# Patient Record
Sex: Male | Born: 1995 | Race: White | Hispanic: No | Marital: Single | Smoking: Never smoker
Health system: Southern US, Community
[De-identification: ages and names within clinical notes are randomized; demographics above are authoritative.]

---
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
## 2018-06-07 ENCOUNTER — Emergency Department (HOSPITAL_COMMUNITY)
Admission: EM | Admit: 2018-06-07 | Discharge: 2018-06-07 | Disposition: A | Discharge status: Home / Self Care | Payer: 59 | Attending: Emergency Medicine | Admitting: Emergency Medicine

## 2018-06-07 ENCOUNTER — Encounter (HOSPITAL_COMMUNITY): Payer: Self-pay | Admitting: Emergency Medicine

## 2018-06-07 ENCOUNTER — Emergency Department (HOSPITAL_COMMUNITY): Payer: 59

## 2018-06-07 ENCOUNTER — Other Ambulatory Visit: Payer: Self-pay

## 2018-06-07 DIAGNOSIS — R109 Unspecified abdominal pain: Secondary | ICD-10-CM | POA: Diagnosis present

## 2018-06-07 DIAGNOSIS — N201 Calculus of ureter: Secondary | ICD-10-CM

## 2018-06-07 LAB — CBC WITH DIFFERENTIAL/PLATELET
Abs Immature Granulocytes: 0.03 10*3/uL (ref 0.00–0.07)
Basophils Absolute: 0 10*3/uL (ref 0.0–0.1)
Basophils Relative: 0 %
Eosinophils Absolute: 0 10*3/uL (ref 0.0–0.5)
Eosinophils Relative: 0 %
HCT: 45.5 % (ref 39.0–52.0)
Hemoglobin: 16 g/dL (ref 13.0–17.0)
Immature Granulocytes: 0 %
Lymphocytes Relative: 11 %
Lymphs Abs: 1.4 10*3/uL (ref 0.7–4.0)
MCH: 31.6 pg (ref 26.0–34.0)
MCHC: 35.2 g/dL (ref 30.0–36.0)
MCV: 89.9 fL (ref 80.0–100.0)
Monocytes Absolute: 1 10*3/uL (ref 0.1–1.0)
Monocytes Relative: 8 %
Neutro Abs: 10.1 10*3/uL — ABNORMAL HIGH (ref 1.7–7.7)
Neutrophils Relative %: 81 %
Platelets: 310 10*3/uL (ref 150–400)
RBC: 5.06 MIL/uL (ref 4.22–5.81)
RDW: 12 % (ref 11.5–15.5)
WBC: 12.6 10*3/uL — ABNORMAL HIGH (ref 4.0–10.5)
nRBC: 0 % (ref 0.0–0.2)

## 2018-06-07 LAB — BASIC METABOLIC PANEL
Anion gap: 10 (ref 5–15)
BUN: 11 mg/dL (ref 6–20)
CO2: 23 mmol/L (ref 22–32)
Calcium: 9.6 mg/dL (ref 8.9–10.3)
Chloride: 106 mmol/L (ref 98–111)
Creatinine, Ser: 1.68 mg/dL — ABNORMAL HIGH (ref 0.61–1.24)
GFR calc Af Amer: >60 mL/min (ref >60)
GFR calc non Af Amer: 56 mL/min — ABNORMAL LOW (ref >60)
Glucose, Bld: 120 mg/dL — ABNORMAL HIGH (ref 70–99)
Potassium: 4.5 mmol/L (ref 3.5–5.1)
Sodium: 139 mmol/L (ref 135–145)

## 2018-06-07 LAB — LIPASE, BLOOD: Lipase: 38 U/L (ref 11–51)

## 2018-06-07 MED ORDER — MORPHINE SULFATE (PF) 4 MG/ML IV SOLN
4.0000 mg | Freq: Once | INTRAVENOUS | Status: AC
  Administered 2018-06-07: 4 mg via INTRAVENOUS
  Filled 2018-06-07: qty 1

## 2018-06-07 MED ORDER — PERCOCET 5-325 MG PO TABS: 1.0000 | ORAL_TABLET | Freq: Four times a day (QID) | ORAL | 0 refills | Status: AC | PRN

## 2018-06-07 MED ORDER — PROMETHAZINE HCL 25 MG PO TABS: 25.0000 mg | ORAL_TABLET | Freq: Four times a day (QID) | ORAL | 0 refills | Status: AC | PRN

## 2018-06-07 MED ORDER — KETOROLAC TROMETHAMINE 30 MG/ML IJ SOLN
30.0000 mg | Freq: Once | INTRAMUSCULAR | Status: AC
  Administered 2018-06-07: 30 mg via INTRAVENOUS
  Filled 2018-06-07: qty 1

## 2018-06-07 MED ORDER — TAMSULOSIN HCL 0.4 MG PO CAPS: 0.4000 mg | ORAL_CAPSULE | Freq: Every day | ORAL | 0 refills | Status: AC

## 2018-06-07 MED ORDER — ONDANSETRON HCL 4 MG/2ML IJ SOLN
4.0000 mg | Freq: Once | INTRAMUSCULAR | Status: AC
  Administered 2018-06-07: 4 mg via INTRAVENOUS
  Filled 2018-06-07: qty 2

## 2018-06-07 MED ORDER — IBUPROFEN 800 MG PO TABS: 800.0000 mg | ORAL_TABLET | Freq: Three times a day (TID) | ORAL | 0 refills | Status: AC | PRN

## 2018-06-07 MED ORDER — SODIUM CHLORIDE 0.9 % IV BOLUS
1000.0000 mL | Freq: Once | INTRAVENOUS | Status: AC
  Administered 2018-06-07: 1000 mL via INTRAVENOUS

## 2018-06-07 NOTE — ED Notes (Signed)
Pt states he is able to call and update his family- declined RN calling his family at this time.

## 2018-06-07 NOTE — Discharge Instructions (Addendum)
Follow-up with the urologist provided.  Return here as needed.  Increase your fluid intake.

## 2018-06-07 NOTE — ED Triage Notes (Signed)
Pt in with sharp RLQ and R flank pain, gradual onset since last night. Emesis x 3, RLQ tender and intermittent.

## 2018-06-07 NOTE — ED Notes (Signed)
Patient Alert and oriented to baseline. Stable and ambulatory to baseline. Patient verbalized understanding of the discharge instructions.  Patient belongings were taken by the patient.   

## 2018-06-07 NOTE — ED Triage Notes (Signed)
Pt states he started having right sided abd pain-right  flank/back pain last evening. Pt states he has had nausea and vomiting. Denies urinary symptoms.

## 2018-06-07 NOTE — ED Notes (Signed)
Pt in CT.

## 2018-06-07 NOTE — ED Provider Notes (Signed)
[name] EMERGENCY DEPARTMENT Provider Note   CSN: [id number] Arrival date & time: 06/07/18  0857    History   Chief Complaint Chief Complaint  Patient presents with  . Flank Pain  . Abdominal Pain    HPI Benjamin Benjamin is a 23 y.o. male.     HPI Patient presents to the emergency department with right-sided flank pain that started last night.  Patient states the pain got worse throughout the night.  He states that he started having vomiting and nausea.  He states that he did not take any medications prior to arrival for his symptoms.  Patient states he has no surgical history.  The patient denies chest pain, shortness of breath, headache,blurred vision, neck pain, fever, cough, weakness, numbness, dizziness, anorexia, edema, rash, back pain, dysuria, hematemesis, bloody stool, near syncope, or syncope. History reviewed. No pertinent past medical history.  There are no active problems to display for this patient.   History reviewed. No pertinent surgical history.      Home Medications    Prior to Admission medications   Not on File    Family History No family history on file.  Social History Social History   Tobacco Use  . Smoking status: Never Smoker  . Smokeless tobacco: Never Used  Substance Use Topics  . Alcohol use: Never    Frequency: Never  . Drug use: Never     Allergies   Patient has no allergy information on record.   Review of Systems Review of Systems All other systems negative except as documented in the HPI. All pertinent positives and negatives as reviewed in the HPI.  Physical Exam Updated Vital Signs BP 129/84   Pulse 80   Temp 98.7 F (37.1 C) (Oral)   Resp 15   Ht 6' (1.829 m)   Wt 90.7 kg   SpO2 98%   BMI 27.12 kg/m   Physical Exam Vitals signs and nursing note reviewed.  Constitutional:      General: He is not in acute distress.    Appearance: He is well-developed.  HENT:     Head: Normocephalic  and atraumatic.  Eyes:     Pupils: Pupils are equal, round, and reactive to light.  Neck:     Musculoskeletal: Normal range of motion and neck supple.  Cardiovascular:     Rate and Rhythm: Normal rate and regular rhythm.     Heart sounds: Normal heart sounds. No murmur. No friction rub. No gallop.   Pulmonary:     Effort: Pulmonary effort is normal. No respiratory distress.     Breath sounds: Normal breath sounds. No wheezing.  Abdominal:     General: Bowel sounds are normal. There is no distension.     Palpations: Abdomen is soft.     Tenderness: There is no abdominal tenderness.  Skin:    General: Skin is warm and dry.     Capillary Refill: Capillary refill takes less than 2 seconds.     Findings: No erythema or rash.  Neurological:     Mental Status: He is alert and oriented to person, place, and time.     Motor: No abnormal muscle tone.     Coordination: Coordination normal.  Psychiatric:        Behavior: Behavior normal.      ED Treatments / Results  Labs (all labs ordered are listed, but only abnormal results are displayed) Labs Reviewed  BASIC METABOLIC PANEL - Abnormal; Notable for the following  components:      Result Value   Glucose, Bld 120 (*)    Creatinine, Ser 1.68 (*)    GFR calc non Af Amer 56 (*)    All other components within normal limits  CBC WITH DIFFERENTIAL/PLATELET - Abnormal; Notable for the following components:   WBC 12.6 (*)    Neutro Abs 10.1 (*)    All other components within normal limits  LIPASE, BLOOD  URINALYSIS, ROUTINE W REFLEX MICROSCOPIC    EKG None  Radiology Ct Renal Stone Study  Result Date: 06/07/2018 CLINICAL DATA:  Right flank pain EXAM: CT ABDOMEN AND PELVIS WITHOUT CONTRAST TECHNIQUE: Multidetector CT imaging of the abdomen and pelvis was performed following the standard protocol without oral or IV contrast. COMPARISON:  None. FINDINGS: Lower chest: There is slight posterior left base atelectasis. There is no lung base  edema or consolidation. Hepatobiliary: No focal liver lesions are apparent on this noncontrast enhanced study. Gallbladder wall is not appreciably thickened. There is no biliary duct dilatation. Pancreas: No pancreatic mass or inflammatory focus. Spleen: No splenic lesions are evident. Adrenals/Urinary Tract: Adrenals bilaterally appear normal. The right kidney is mildly edematous. There is no evident renal mass on either side. There is moderate hydronephrosis on the right. There is no appreciable hydronephrosis on the left. There is a 2 mm calculus in the lower pole left kidney. There is no intrarenal calculus on the right. There is a 2 mm calculus in the proximal right ureter at the L3 level. No other ureteral calculi are evident on either side. Urinary bladder is midline with wall thickness within normal limits. Stomach/Bowel: There is no appreciable bowel wall or mesenteric thickening. There is no evident bowel obstruction. Terminal ileum appears normal. There is no free air or portal venous air. Vascular/Lymphatic: There is no abdominal aortic aneurysm. No vascular lesions are demonstrable on this study. There is no adenopathy in the abdomen or pelvis. Reproductive: Prostate and seminal vesicles appear normal in size and contour. There is no appreciable pelvic mass. Other: The appendix appears normal. There is no evident abscess or ascites in the abdomen or pelvis. Musculoskeletal: There are no blastic or lytic bone lesions. There is no intramuscular or abdominal wall lesion. IMPRESSION: 1. 2 mm calculus at the level of L3 on the right causing moderate hydronephrosis on the right. 2.  Nonobstructing 2 mm calculus lower pole left kidney. 3. No evident bowel obstruction. No abscess in the abdomen or pelvis. Appendix appears normal. Electronically Signed   By: Bretta Bang III M.D.   On: 06/07/2018 10:49    Procedures Procedures (including critical care time)  Medications Ordered in ED Medications   morphine 4 MG/ML injection 4 mg (4 mg Intravenous Given 06/07/18 0931)  ondansetron (ZOFRAN) injection 4 mg (4 mg Intravenous Given 06/07/18 0931)  sodium chloride 0.9 % bolus 1,000 mL (1,000 mLs Intravenous New Bag/Given 06/07/18 0931)  ketorolac (TORADOL) 30 MG/ML injection 30 mg (30 mg Intravenous Given 06/07/18 1213)     Initial Impression / Assessment and Plan / ED Course  I have reviewed the triage vital signs and the nursing notes.  Pertinent labs & imaging results that were available during my care of the patient were reviewed by me and considered in my medical decision making (see chart for details).        Patient has dramatically improved following pain medications.  The patient will be discharged home with medications and given referral to urology.  The patient is advised of  the CT scan findings along with laboratory results.  Patient was given IV hydration due to his creatinine.  Patient is advised to increase his fluid intake.  Final Clinical Impressions(s) / ED Diagnoses   Final diagnoses:  None    ED Discharge Orders    None       Charlestine NightLawyer, Brenda Cowher, PA-C 06/07/18 1231    Benjiman CorePickering, Nathan, MD 06/07/18 1530

## 2020-08-07 IMAGING — CT CT RENAL STONE PROTOCOL
2 of 4 series · 16 of 46 positions shown, 18 images · non-contrast
Comparison: None.

CLINICAL DATA: Right flank pain

EXAM:
CT ABDOMEN AND PELVIS WITHOUT CONTRAST
TECHNIQUE: Multidetector CT imaging of the abdomen and pelvis was performed
following the standard protocol without oral or IV contrast.

[Series 3: renal stone 5.0 · axial · 0.83mm/px · z∈[-820,-325]mm · 13 of 109 slices shown, 15 images]
[im 5/109  soft-tissue]
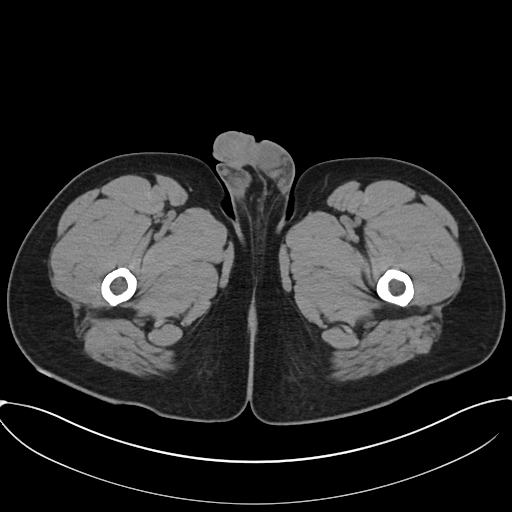
[im 5/109  bone]
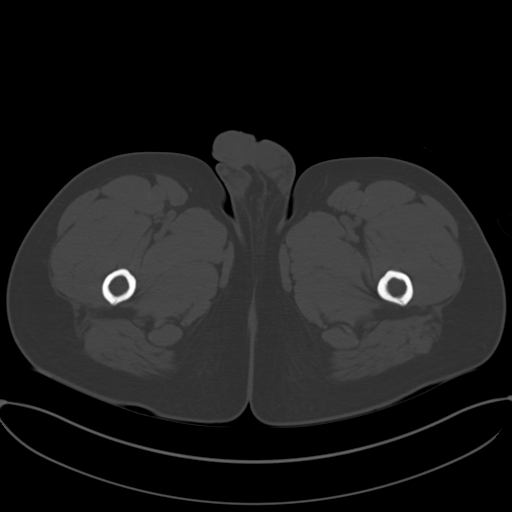
[im 13/109  soft-tissue]
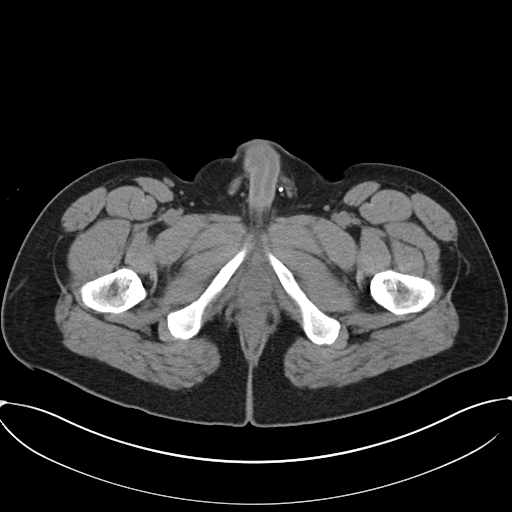
[im 22/109  soft-tissue]
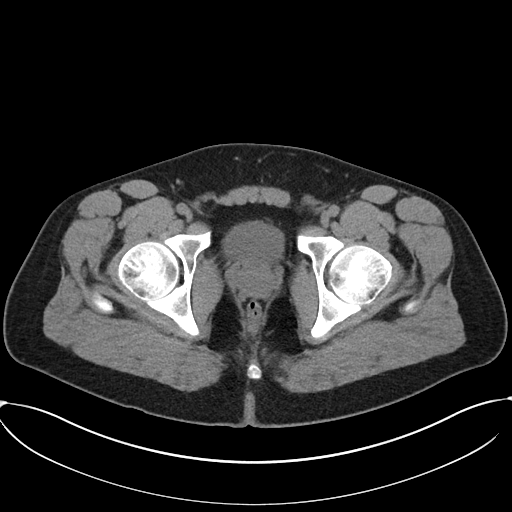
[im 31/109  soft-tissue]
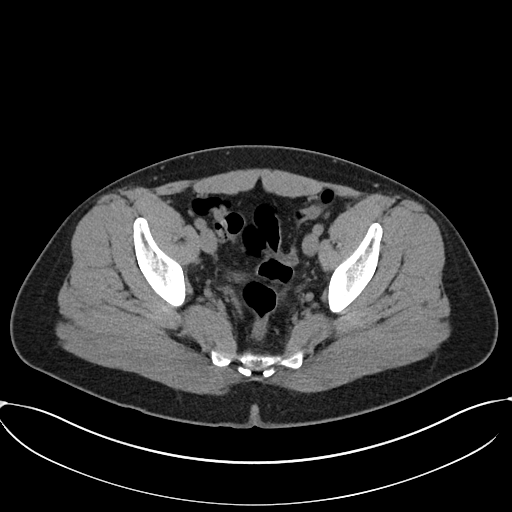
[im 39/109  soft-tissue]
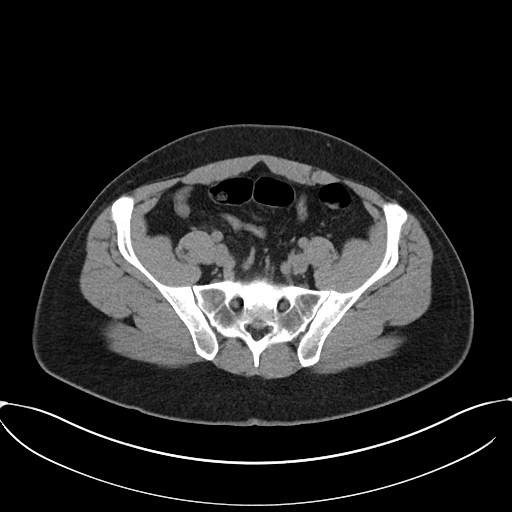
[im 48/109  soft-tissue]
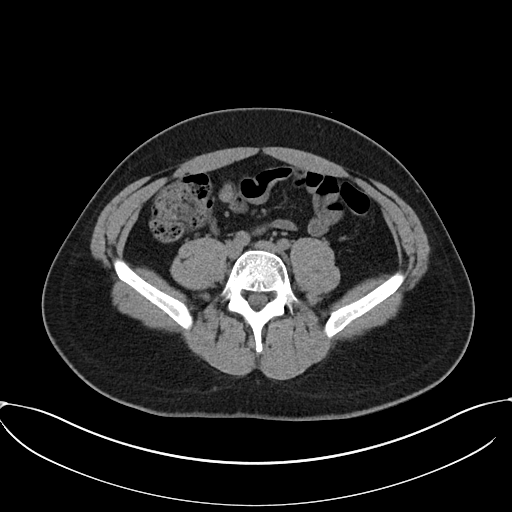
[im 57/109  soft-tissue]
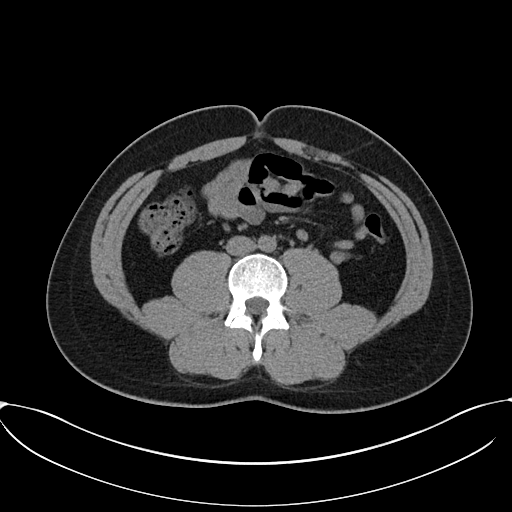
[im 61/109  soft-tissue]
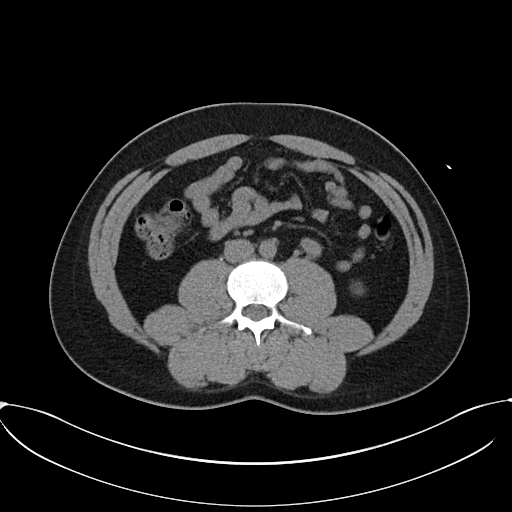
[im 70/109  soft-tissue]
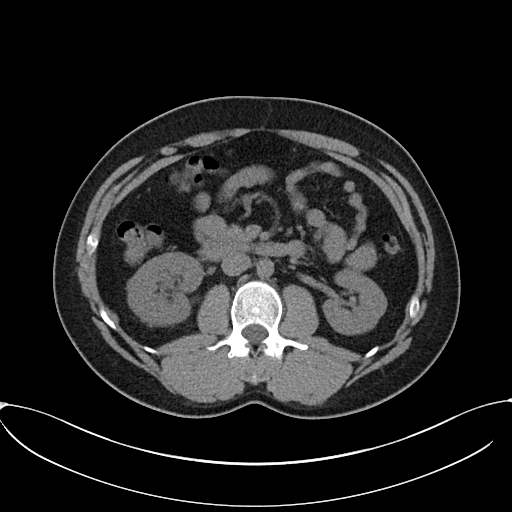
[im 70/109  bone]
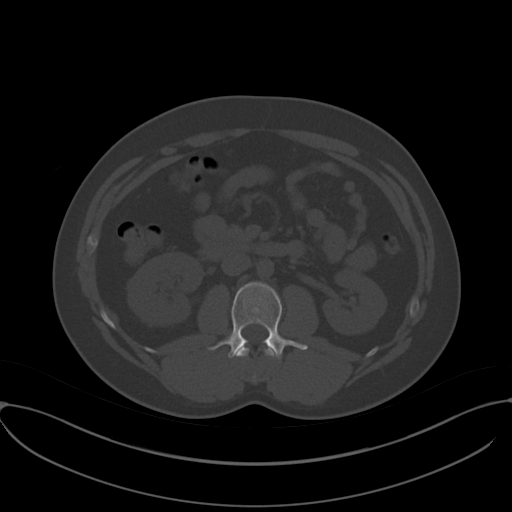
[im 78/109  soft-tissue]
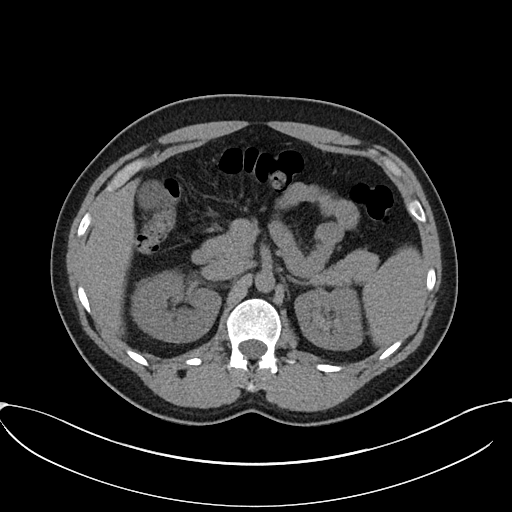
[im 87/109  soft-tissue]
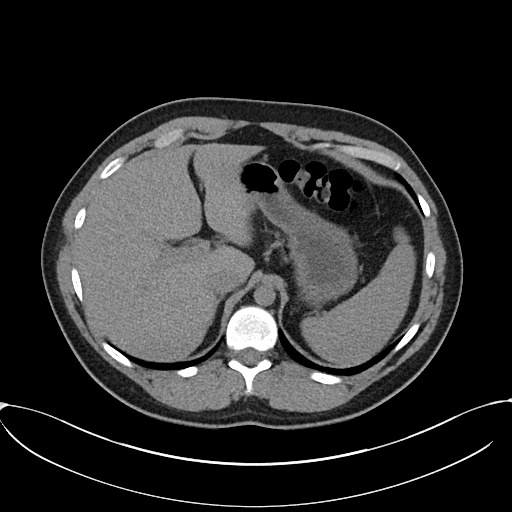
[im 96/109  soft-tissue]
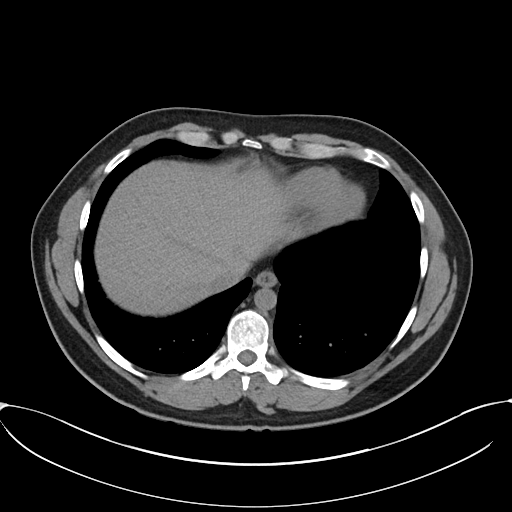
[im 104/109  soft-tissue]
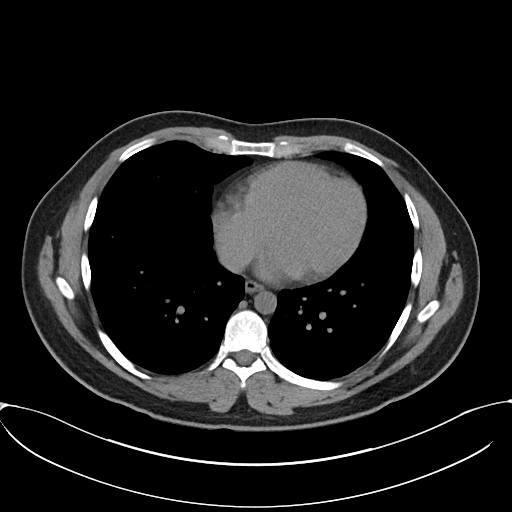

[Series 5: renal stone 3.0 cor · coronal · 1.26mm/px · 3 of 101 slices shown]
[im 34/101  soft-tissue]
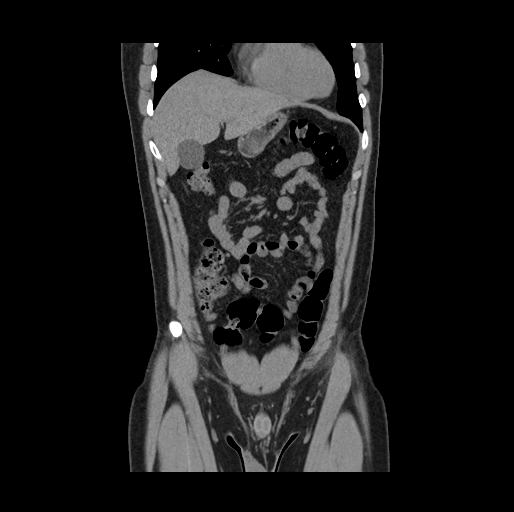
[im 45/101  soft-tissue]
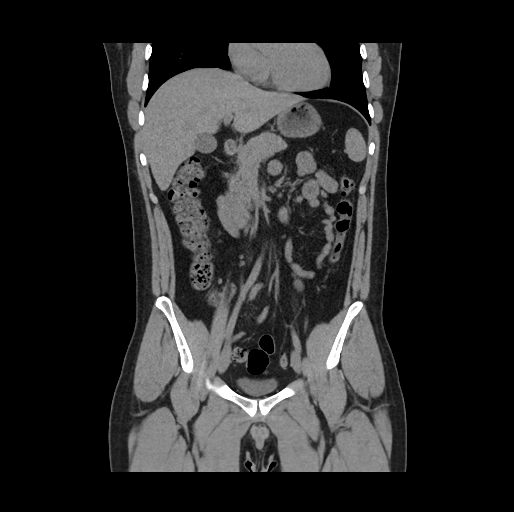
[im 56/101  soft-tissue]
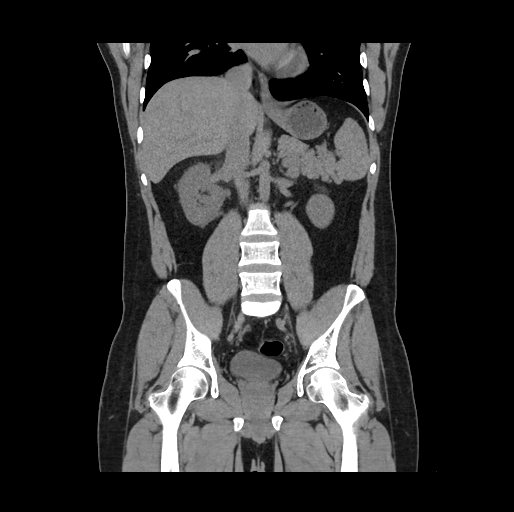

[16 of 46 positions shown; findings below may reference images not displayed]

FINDINGS: Lower chest: There is slight posterior left base atelectasis. There
is no lung base edema or consolidation.

Hepatobiliary: No focal liver lesions are apparent on this
noncontrast enhanced study. Gallbladder wall is not appreciably
thickened. There is no biliary duct dilatation.

Pancreas: No pancreatic mass or inflammatory focus.

Spleen: No splenic lesions are evident.

Adrenals/Urinary Tract: Adrenals bilaterally appear normal. The
right kidney is mildly edematous. There is no evident renal mass on
either side. There is moderate hydronephrosis on the right. There is
no appreciable hydronephrosis on the left. There is a 2 mm calculus
in the lower pole left kidney. There is no intrarenal calculus on
the right. There is a 2 mm calculus in the proximal right ureter at
the L3 level. No other ureteral calculi are evident on either side.
Urinary bladder is midline with wall thickness within normal limits.

Stomach/Bowel: There is no appreciable bowel wall or mesenteric
thickening. There is no evident bowel obstruction. Terminal ileum
appears normal. There is no free air or portal venous air.

Vascular/Lymphatic: There is no abdominal aortic aneurysm. No
vascular lesions are demonstrable on this study. There is no
adenopathy in the abdomen or pelvis.

Reproductive: Prostate and seminal vesicles appear normal in size
and contour. There is no appreciable pelvic mass.

Other: The appendix appears normal. There is no evident abscess or
ascites in the abdomen or pelvis.

Musculoskeletal: There are no blastic or lytic bone lesions. There
is no intramuscular or abdominal wall lesion.
IMPRESSION: 1. 2 mm calculus at the level of L3 on the right causing moderate
hydronephrosis on the right.

2.  Nonobstructing 2 mm calculus lower pole left kidney.

3. No evident bowel obstruction. No abscess in the abdomen or
pelvis. Appendix appears normal.
